# Patient Record
Sex: Male | Born: 1995
Health system: Southern US, Community
[De-identification: ages and names within clinical notes are randomized; demographics above are authoritative.]

## PROBLEM LIST (undated history)

## (undated) DIAGNOSIS — S42001A Fracture of unspecified part of right clavicle, initial encounter for closed fracture: Secondary | ICD-10-CM

## (undated) DIAGNOSIS — F909 Attention-deficit hyperactivity disorder, unspecified type: Principal | ICD-10-CM

## (undated) HISTORY — DX: Fracture of unspecified part of right clavicle, initial encounter for closed fracture: S42.001A

## (undated) HISTORY — DX: Attention-deficit hyperactivity disorder, unspecified type: F90.9

---
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
## 2017-11-10 IMAGING — DX DG LUMBAR SPINE COMPLETE 4+V
5 series · 5 of 5 positions shown · non-contrast
Comparison: None.

CLINICAL DATA: C/o mid lower back pain x 1 year. NO injury.

EXAM:
LUMBAR SPINE - COMPLETE 4+ VIEW

[l-spine ap]
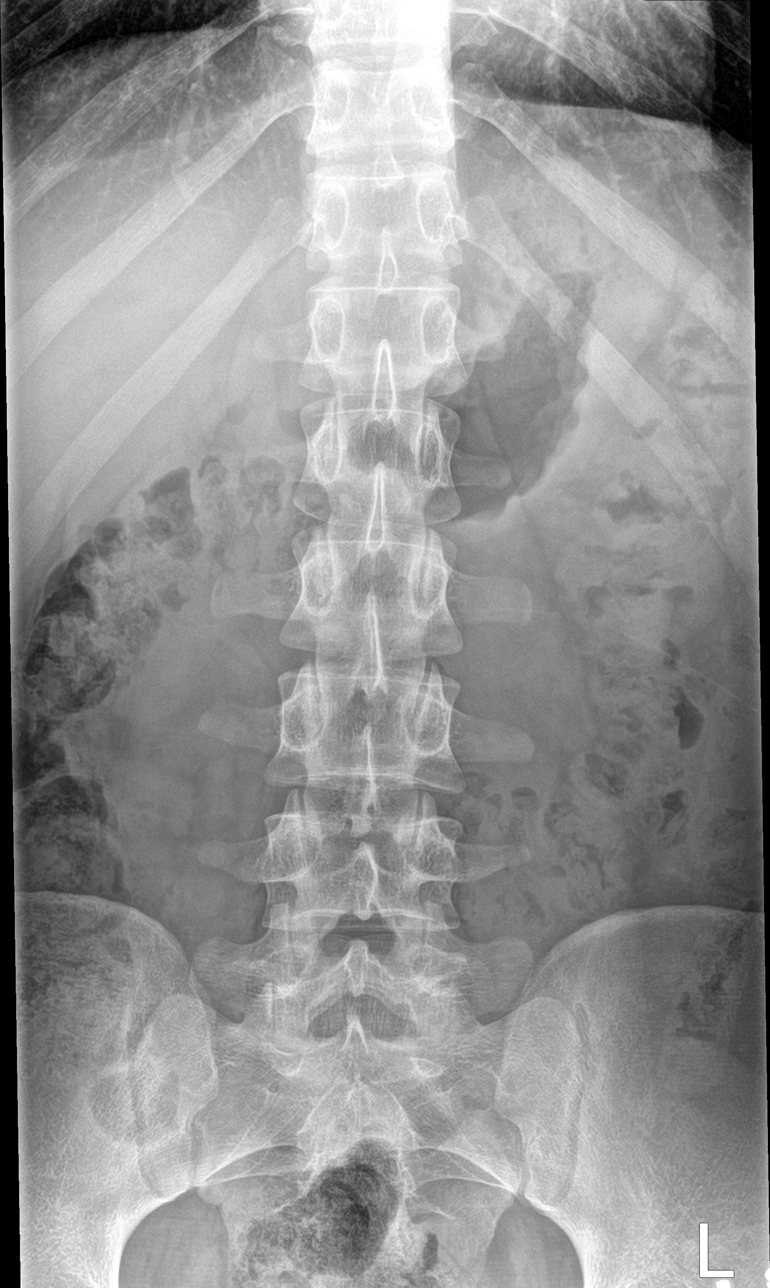

[l-spine obl (1 of 2)]
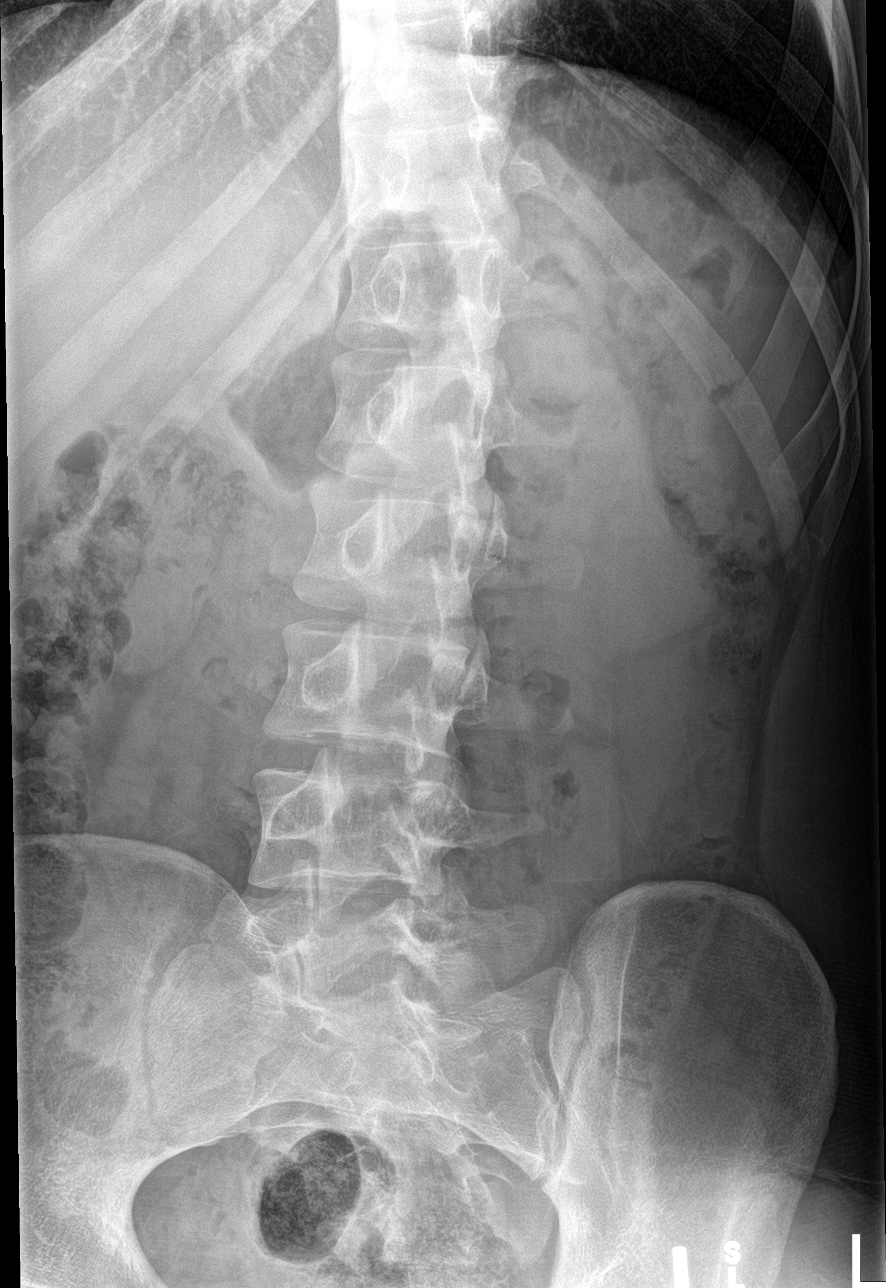

[l-spine obl (2 of 2)]
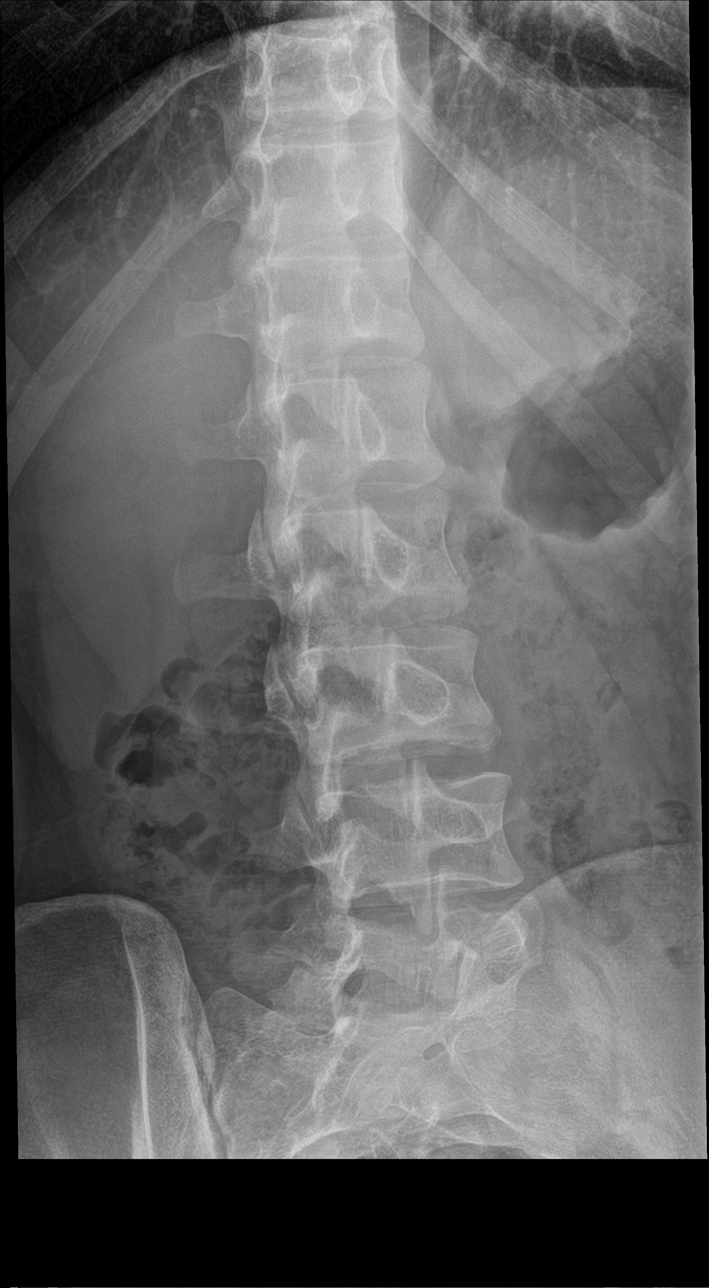

[l-spine lat]
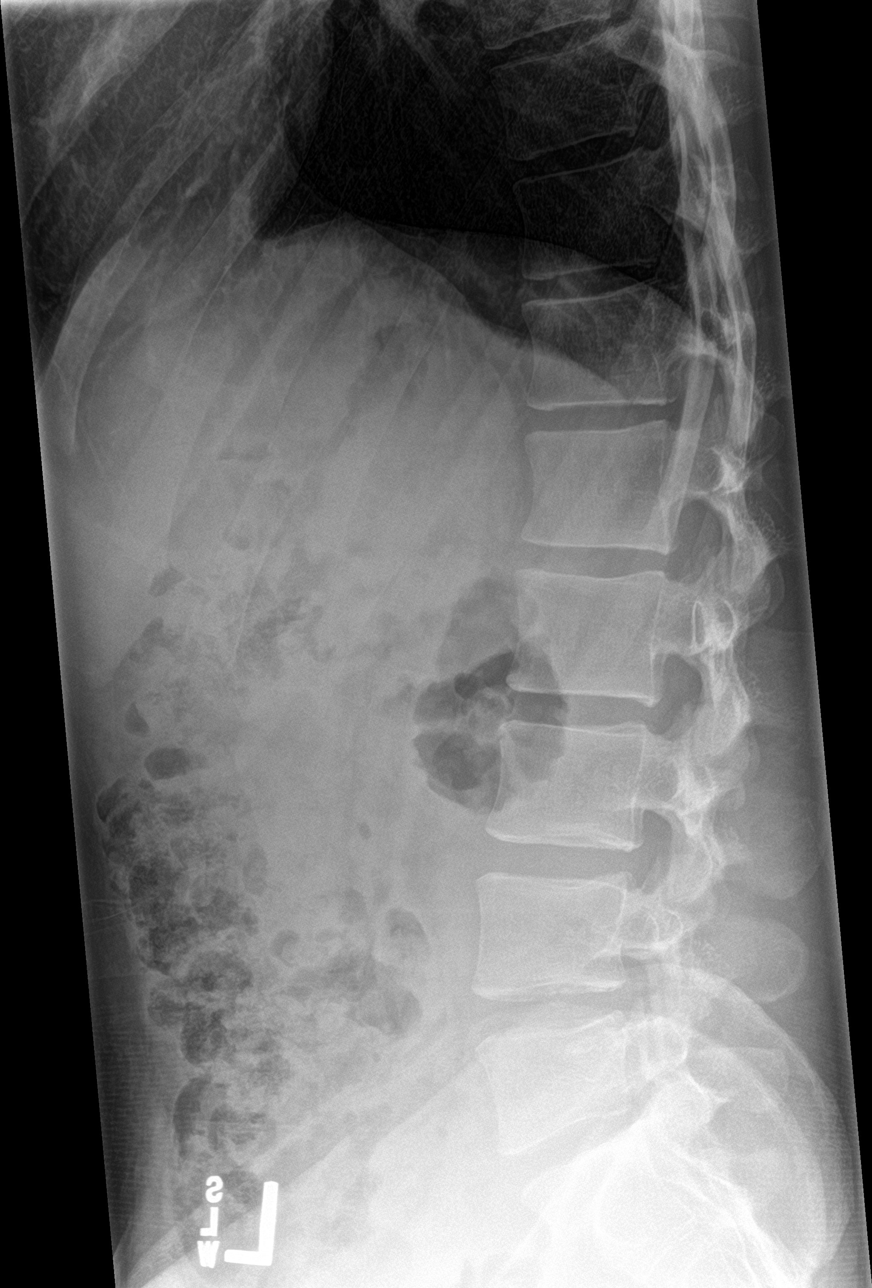

[l-spine spot]
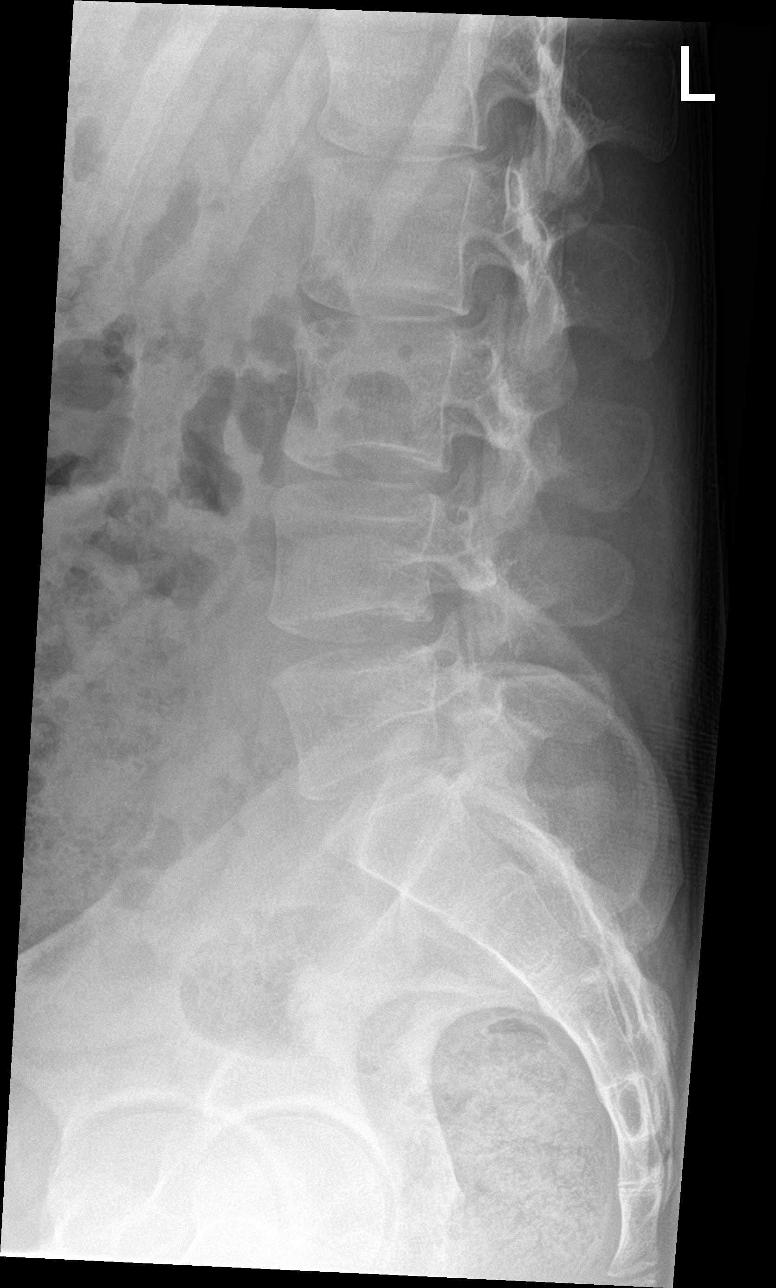

[5 of 5 positions shown; findings below may reference images not displayed]

FINDINGS: Six lumbar type vertebral bodies. Sacroiliac joints are symmetric.
Maintenance of vertebral body height and alignment. Intervertebral
disc heights are maintained.
IMPRESSION: No acute osseous abnormality.
